# Patient Record
Sex: Female | Born: 1985 | Race: White | Hispanic: No | Marital: Single | Smoking: Current every day smoker
Health system: Southern US, Community
[De-identification: ages and names within clinical notes are randomized; demographics above are authoritative.]

## PROBLEM LIST (undated history)

## (undated) DIAGNOSIS — K8020 Calculus of gallbladder without cholecystitis without obstruction: Secondary | ICD-10-CM

## (undated) HISTORY — PX: OTHER SURGICAL HISTORY: SHX169

---
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
## 2009-09-07 ENCOUNTER — Emergency Department: Payer: Self-pay | Admitting: Emergency Medicine

## 2009-11-09 ENCOUNTER — Emergency Department: Payer: Self-pay | Admitting: Emergency Medicine

## 2011-01-04 ENCOUNTER — Emergency Department: Payer: Self-pay | Admitting: *Deleted

## 2014-03-04 ENCOUNTER — Ambulatory Visit: Payer: Self-pay | Admitting: Family Medicine

## 2014-03-04 LAB — URINALYSIS, COMPLETE

## 2014-03-04 LAB — COMPREHENSIVE METABOLIC PANEL
AST: 362 U/L — AB (ref 15–37)
Albumin: 3.9 g/dL (ref 3.4–5.0)
Alkaline Phosphatase: 100 U/L
Anion Gap: 10 (ref 7–16)
BUN: 11 mg/dL (ref 7–18)
Bilirubin,Total: 1.8 mg/dL — ABNORMAL HIGH (ref 0.2–1.0)
CO2: 26 mmol/L (ref 21–32)
CREATININE: 0.88 mg/dL (ref 0.60–1.30)
Calcium, Total: 9.2 mg/dL (ref 8.5–10.1)
Chloride: 102 mmol/L (ref 98–107)
EGFR (Non-African Amer.): >60
Glucose: 104 mg/dL — ABNORMAL HIGH (ref 65–99)
OSMOLALITY: 275 (ref 275–301)
POTASSIUM: 3.5 mmol/L (ref 3.5–5.1)
SGPT (ALT): 569 U/L — ABNORMAL HIGH
SODIUM: 138 mmol/L (ref 136–145)
Total Protein: 8 g/dL (ref 6.4–8.2)

## 2014-03-04 LAB — CBC WITH DIFFERENTIAL/PLATELET
BASOS ABS: 0.1 10*3/uL (ref 0.0–0.1)
Basophil %: 0.5 %
EOS ABS: 0.1 10*3/uL (ref 0.0–0.7)
Eosinophil %: 1.3 %
HCT: 43.5 % (ref 35.0–47.0)
HGB: 14.7 g/dL (ref 12.0–16.0)
Lymphocyte #: 1.9 10*3/uL (ref 1.0–3.6)
Lymphocyte %: 18.5 %
MCH: 31.3 pg (ref 26.0–34.0)
MCHC: 33.8 g/dL (ref 32.0–36.0)
MCV: 93 fL (ref 80–100)
Monocyte #: 0.7 x10 3/mm (ref 0.2–0.9)
Monocyte %: 7.2 %
NEUTROS ABS: 7.6 10*3/uL — AB (ref 1.4–6.5)
NEUTROS PCT: 72.5 %
Platelet: 312 10*3/uL (ref 150–440)
RBC: 4.69 10*6/uL (ref 3.80–5.20)
RDW: 12.7 % (ref 11.5–14.5)
WBC: 10.4 10*3/uL (ref 3.6–11.0)

## 2014-03-04 LAB — PREGNANCY, URINE: PREGNANCY TEST, URINE: NEGATIVE m[IU]/mL

## 2014-03-05 ENCOUNTER — Encounter: Payer: Self-pay | Admitting: *Deleted

## 2014-03-06 LAB — URINE CULTURE

## 2014-03-16 ENCOUNTER — Ambulatory Visit: Payer: Self-pay | Admitting: General Surgery

## 2014-04-01 ENCOUNTER — Encounter: Payer: Self-pay | Admitting: *Deleted

## 2016-05-03 LAB — HM PAP SMEAR: HM Pap smear: NEGATIVE

## 2016-09-07 ENCOUNTER — Emergency Department
Admission: EM | Admit: 2016-09-07 | Discharge: 2016-09-07 | Disposition: A | Discharge status: Home / Self Care | Payer: Self-pay | Attending: Emergency Medicine | Admitting: Emergency Medicine

## 2016-09-07 ENCOUNTER — Encounter: Payer: Self-pay | Admitting: Emergency Medicine

## 2016-09-07 DIAGNOSIS — Z79899 Other long term (current) drug therapy: Secondary | ICD-10-CM | POA: Insufficient documentation

## 2016-09-07 DIAGNOSIS — K2900 Acute gastritis without bleeding: Secondary | ICD-10-CM | POA: Insufficient documentation

## 2016-09-07 DIAGNOSIS — R1010 Upper abdominal pain, unspecified: Secondary | ICD-10-CM | POA: Insufficient documentation

## 2016-09-07 DIAGNOSIS — F17200 Nicotine dependence, unspecified, uncomplicated: Secondary | ICD-10-CM | POA: Insufficient documentation

## 2016-09-07 HISTORY — DX: Calculus of gallbladder without cholecystitis without obstruction: K80.20

## 2016-09-07 LAB — COMPREHENSIVE METABOLIC PANEL
ALBUMIN: 4.1 g/dL (ref 3.5–5.0)
ALT: 15 U/L (ref 14–54)
AST: 16 U/L (ref 15–41)
Alkaline Phosphatase: 49 U/L (ref 38–126)
Anion gap: 7 (ref 5–15)
BILIRUBIN TOTAL: 0.3 mg/dL (ref 0.3–1.2)
BUN: 16 mg/dL (ref 6–20)
CHLORIDE: 107 mmol/L (ref 101–111)
CO2: 26 mmol/L (ref 22–32)
CREATININE: 0.7 mg/dL (ref 0.44–1.00)
Calcium: 9.4 mg/dL (ref 8.9–10.3)
GFR calc Af Amer: >60 mL/min (ref >60)
GFR calc non Af Amer: >60 mL/min (ref >60)
GLUCOSE: 100 mg/dL — AB (ref 65–99)
POTASSIUM: 4.4 mmol/L (ref 3.5–5.1)
Sodium: 140 mmol/L (ref 135–145)
Total Protein: 7.7 g/dL (ref 6.5–8.1)

## 2016-09-07 LAB — URINALYSIS, COMPLETE (UACMP) WITH MICROSCOPIC
Bilirubin Urine: NEGATIVE
Glucose, UA: NEGATIVE mg/dL
Ketones, ur: NEGATIVE mg/dL
Leukocytes, UA: NEGATIVE
Nitrite: NEGATIVE
PH: 5 (ref 5.0–8.0)
Protein, ur: NEGATIVE mg/dL
Specific Gravity, Urine: 1.023 (ref 1.005–1.030)

## 2016-09-07 LAB — CBC
HEMATOCRIT: 43.1 % (ref 35.0–47.0)
Hemoglobin: 14.9 g/dL (ref 12.0–16.0)
MCH: 31.7 pg (ref 26.0–34.0)
MCHC: 34.4 g/dL (ref 32.0–36.0)
MCV: 92.1 fL (ref 80.0–100.0)
PLATELETS: 313 10*3/uL (ref 150–440)
RBC: 4.68 MIL/uL (ref 3.80–5.20)
RDW: 12.8 % (ref 11.5–14.5)
WBC: 8 10*3/uL (ref 3.6–11.0)

## 2016-09-07 LAB — LIPASE, BLOOD: LIPASE: 22 U/L (ref 11–51)

## 2016-09-07 LAB — POCT PREGNANCY, URINE: PREG TEST UR: NEGATIVE

## 2016-09-07 MED ORDER — ONDANSETRON 4 MG PO TBDP: 4.0000 mg | ORAL_TABLET | Freq: Three times a day (TID) | ORAL | 0 refills | Status: DC | PRN

## 2016-09-07 MED ORDER — FAMOTIDINE 20 MG PO TABS: 20.0000 mg | ORAL_TABLET | Freq: Two times a day (BID) | ORAL | 0 refills | Status: DC

## 2016-09-07 MED ORDER — FAMOTIDINE 20 MG PO TABS
40.0000 mg | ORAL_TABLET | Freq: Once | ORAL | Status: AC
  Administered 2016-09-07: 40 mg via ORAL
  Filled 2016-09-07: qty 2

## 2016-09-07 MED ORDER — GI COCKTAIL ~~LOC~~
30.0000 mL | ORAL | Status: AC
  Administered 2016-09-07: 30 mL via ORAL
  Filled 2016-09-07: qty 30

## 2016-09-07 NOTE — ED Provider Notes (Signed)
[facility] Emergency Department Provider Note  ____________________________________________  Time seen: Approximately 2:39 PM  I have reviewed the triage vital signs and the nursing notes.   HISTORY  Chief Complaint Abdominal Pain    HPI Tammy Nash is a 31 y.o. female who complains of upper abdominal pain that started yesterday. Coming in intermittent episodes, nonradiating, crampy and sharp. Associated nausea but no vomiting diarrhea or constipation. Currently feeling calm and comfortable. No fever or chills. No syncope. No dysuria frequency urgency. No aggravating or alleviating factors to the abdominal pain.     Past Medical History:  Diagnosis Date  . Cholecystolithiasis      There are no active problems to display for this patient.    History reviewed. No pertinent surgical history.   Prior to Admission medications   Medication Sig Start Date End Date Taking? Authorizing Provider  acidophilus (RISAQUAD) CAPS capsule Take 1 capsule by mouth daily.   Yes [provider]  CRANBERRY PO Take 1 tablet by mouth daily.   Yes [provider]  famotidine (PEPCID) 20 MG tablet Take 1 tablet (20 mg total) by mouth 2 (two) times daily. 09/07/16   Sharman CheekStafford, Sammy Cassar, MD  ondansetron (ZOFRAN ODT) 4 MG disintegrating tablet Take 1 tablet (4 mg total) by mouth every 8 (eight) hours as needed for nausea or vomiting. 09/07/16   Sharman CheekStafford, Samoria Fedorko, MD     Allergies Patient has no known allergies.   No family history on file.  Social History Social History  Substance Use Topics  . Smoking status: Current Every Day Smoker  . Smokeless tobacco: Never Used  . Alcohol use Yes    Review of Systems  Constitutional:   No fever or chills.  ENT:   No sore throat. No rhinorrhea. Cardiovascular:   No chest pain or syncope. Respiratory:   No dyspnea or cough. Gastrointestinal:   Positive as above for abdominal pain without vomiting and  diarrhea.  Musculoskeletal:   Negative for focal pain or swelling All other systems reviewed and are negative except as documented above in ROS and HPI.  ____________________________________________   PHYSICAL EXAM:  VITAL SIGNS: ED Triage Vitals  Enc Vitals Group     BP 09/07/16 0941 119/87     Pulse Rate 09/07/16 0941 77     Resp 09/07/16 0941 14     Temp 09/07/16 0941 98 F (36.7 C)     Temp Source 09/07/16 0941 Oral     SpO2 09/07/16 0941 99 %     Weight 09/07/16 0941 167 lb (75.8 kg)     Height 09/07/16 0941 5\' 2"  (1.575 m)     Head Circumference --      Peak Flow --      Pain Score 09/07/16 0940 2     Pain Loc --      Pain Edu? --      Excl. in GC? --     Vital signs reviewed, nursing assessments reviewed.   Constitutional:   Alert and oriented. Well appearing and in no distress. Eyes:   No scleral icterus.  EOMI. No nystagmus. No conjunctival pallor. PERRL. ENT   Head:   Normocephalic and atraumatic.   Nose:   No congestion/rhinnorhea.    Mouth/Throat:   MMM, no pharyngeal erythema. No peritonsillar mass.    Neck:   No meningismus. Full ROM Hematological/Lymphatic/Immunilogical:   No cervical lymphadenopathy. Cardiovascular:   RRR. Symmetric bilateral radial and DP pulses.  No murmurs.  Respiratory:  Normal respiratory effort without tachypnea/retractions. Breath sounds are clear and equal bilaterally. No wheezes/rales/rhonchi. Gastrointestinal:   SoftWith slight suprapubic discomfort. No right upper quadrant pain or Murphy sign. Non distended. There is no CVA tenderness.  No rebound, rigidity, or guarding. Genitourinary:   deferred Musculoskeletal:   Normal range of motion in all extremities. No joint effusions.  No lower extremity tenderness.  No edema. Neurologic:   Normal speech and language.  Motor grossly intact. No gross focal neurologic deficits are appreciated.  Skin:    Skin is warm, dry and intact. No rash noted.  No petechiae, purpura,  or bullae.  ____________________________________________    LABS (pertinent positives/negatives) (all labs ordered are listed, but only abnormal results are displayed) Labs Reviewed  COMPREHENSIVE METABOLIC PANEL - Abnormal; Notable for the following:       Result Value   Glucose, Bld 100 (*)    All other components within normal limits  URINALYSIS, COMPLETE (UACMP) WITH MICROSCOPIC - Abnormal; Notable for the following:    Color, Urine YELLOW (*)    APPearance HAZY (*)    Hgb urine dipstick MODERATE (*)    Bacteria, UA RARE (*)    Squamous Epithelial / LPF 6-30 (*)    All other components within normal limits  LIPASE, BLOOD  CBC  POC URINE PREG, ED  POCT PREGNANCY, URINE   ____________________________________________   EKG    ____________________________________________    RADIOLOGY  No results found.  ____________________________________________   PROCEDURES Procedures  ____________________________________________   INITIAL IMPRESSION / ASSESSMENT AND PLAN / ED COURSE  Pertinent labs & imaging results that were available during my care of the patient were reviewed by me and considered in my medical decision making (see chart for details).  Patient well appearing no acute distress, normal vital signs, normal labs. Benign and reassuring exam.Considering the patient's symptoms, medical history, and physical examination today, I have low suspicion for cholecystitis or biliary pathology, pancreatitis, perforation or bowel obstruction, hernia, intra-abdominal abscess, AAA or dissection, volvulus or intussusception, mesenteric ischemia, or appendicitis.  Workup negative, likely gastritis related to eating new foods at the symptom onset including crab legs. Started on Zofran and Pepcid and follow up with primary care      ____________________________________________   FINAL CLINICAL IMPRESSION(S) / ED DIAGNOSES  Final diagnoses:  Pain of upper abdomen   Acute gastritis without hemorrhage, unspecified gastritis type      New Prescriptions   FAMOTIDINE (PEPCID) 20 MG TABLET    Take 1 tablet (20 mg total) by mouth 2 (two) times daily.   ONDANSETRON (ZOFRAN ODT) 4 MG DISINTEGRATING TABLET    Take 1 tablet (4 mg total) by mouth every 8 (eight) hours as needed for nausea or vomiting.     Portions of this note were generated with dragon dictation software. Dictation errors may occur despite best attempts at proofreading.    Sharman Cheek, MD 09/07/16 [phone]

## 2016-09-07 NOTE — ED Triage Notes (Signed)
Says mid upper abd pain started yesterday. 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 Has lost wwieght and did dietary changes.

## 2016-09-07 NOTE — Discharge Instructions (Signed)
Your blood tests and urine tests today were unremarkable. Follow-up with primary care for continued monitoring of your symptoms.

## 2017-01-29 ENCOUNTER — Encounter: Payer: Self-pay | Admitting: Emergency Medicine

## 2017-01-29 ENCOUNTER — Emergency Department: Payer: Self-pay

## 2017-01-29 ENCOUNTER — Emergency Department
Admission: EM | Admit: 2017-01-29 | Discharge: 2017-01-29 | Disposition: A | Discharge status: Home / Self Care | Payer: Self-pay | Attending: Emergency Medicine | Admitting: Emergency Medicine

## 2017-01-29 DIAGNOSIS — Y999 Unspecified external cause status: Secondary | ICD-10-CM | POA: Insufficient documentation

## 2017-01-29 DIAGNOSIS — W19XXXA Unspecified fall, initial encounter: Secondary | ICD-10-CM | POA: Insufficient documentation

## 2017-01-29 DIAGNOSIS — Y939 Activity, unspecified: Secondary | ICD-10-CM | POA: Insufficient documentation

## 2017-01-29 DIAGNOSIS — Z79899 Other long term (current) drug therapy: Secondary | ICD-10-CM | POA: Insufficient documentation

## 2017-01-29 DIAGNOSIS — Y929 Unspecified place or not applicable: Secondary | ICD-10-CM | POA: Insufficient documentation

## 2017-01-29 DIAGNOSIS — M25462 Effusion, left knee: Secondary | ICD-10-CM | POA: Insufficient documentation

## 2017-01-29 DIAGNOSIS — Z87891 Personal history of nicotine dependence: Secondary | ICD-10-CM | POA: Insufficient documentation

## 2017-01-29 MED ORDER — MELOXICAM 15 MG PO TABS: 15.0000 mg | ORAL_TABLET | Freq: Every day | ORAL | 0 refills | Status: DC

## 2017-01-29 NOTE — ED Provider Notes (Signed)
[facility] Emergency Department Provider Note  ____________________________________________  Time seen: Approximately 6:14 PM  I have reviewed the triage vital signs and the nursing notes.   HISTORY  Chief Complaint Knee Pain    HPI Tammy Nash is a 31 y.o. female who presents emergency department complaining of left knee pain.  Patient reports that she has had 2 injuries to the knee, one striking her knee on a desk as well as falling on the patient reports that the knee is swollen, painful.  She has a remote history of patellar dislocation.  Patient reports at this time she did not see any deviation of the patella and did not have to reduce pain.  She was concerned that she may have done other injury including fracture from the fall.  She has been able to bear weight on the knee while wearing the brace from a previous injury.  She denies any numbness or tingling.  No other injury or complaint.  She has not been taking any medications for this complaint prior to arrival.  Past Medical History:  Diagnosis Date  . Cholecystolithiasis     There are no active problems to display for this patient.   History reviewed. No pertinent surgical history.  Prior to Admission medications   Medication Sig Start Date End Date Taking? Authorizing Provider  acidophilus (RISAQUAD) CAPS capsule Take 1 capsule by mouth daily.    [provider]  CRANBERRY PO Take 1 tablet by mouth daily.    [provider]  famotidine (PEPCID) 20 MG tablet Take 1 tablet (20 mg total) by mouth 2 (two) times daily. 09/07/16   Sharman Cheek, MD  meloxicam (MOBIC) 15 MG tablet Take 1 tablet (15 mg total) by mouth daily. 01/29/17   Cuthriell, Delorise Royals, PA-C  ondansetron (ZOFRAN ODT) 4 MG disintegrating tablet Take 1 tablet (4 mg total) by mouth every 8 (eight) hours as needed for nausea or vomiting. 09/07/16   Sharman Cheek, MD    Allergies Patient has no known  allergies.  No family history on file.  Social History Social History  Substance Use Topics  . Smoking status: Former Smoker    Types: E-cigarettes  . Smokeless tobacco: Never Used  . Alcohol use Yes     Review of Systems  Constitutional: No fever/chills Eyes: No visual changes.  Cardiovascular: no chest pain. Respiratory: no cough. No SOB. Gastrointestinal: No abdominal pain.  No nausea, no vomiting.   Musculoskeletal: Positive for left knee pain and swelling Skin: Negative for rash, abrasions, lacerations, ecchymosis. Neurological: Negative for headaches, focal weakness or numbness. 10-point ROS otherwise negative.  ____________________________________________   PHYSICAL EXAM:  VITAL SIGNS: ED Triage Vitals  Enc Vitals Group     BP 01/29/17 1730 112/80     Pulse Rate 01/29/17 1730 86     Resp 01/29/17 1730 18     Temp 01/29/17 1730 97.9 F (36.6 C)     Temp Source 01/29/17 1730 Oral     SpO2 01/29/17 1730 98 %     Weight 01/29/17 1731 165 lb (74.8 kg)     Height 01/29/17 1731 5\' 2"  (1.575 m)     Head Circumference --      Peak Flow --      Pain Score 01/29/17 1730 3     Pain Loc --      Pain Edu? --      Excl. in GC? --      Constitutional: Alert and  oriented. Well appearing and in no acute distress. Eyes: Conjunctivae are normal. PERRL. EOMI. Head: Atraumatic. Neck: No stridor.    Cardiovascular: Normal rate, regular rhythm. Normal S1 and S2.  Good peripheral circulation. Respiratory: Normal respiratory effort without tachypnea or retractions. Lungs CTAB. Good air entry to the bases with no decreased or absent breath sounds. Musculoskeletal: Full range of motion to all extremities. No gross deformities appreciated.  His left knee is edematous when compared with right.  Patient has good range of motion with coaxing.  Patient is tender palpation superiorly to patella.  No other frank tenderness to palpation.  No frank ballottement.  Patellar tendon and  ligament are appreciated with no deficits.  Varus, valgus, Lachman's, McMurray's is negative.  Dorsalis pedis pulse intact distally.  Sensation intact distally. Neurologic:  Normal speech and language. No gross focal neurologic deficits are appreciated.  Skin:  Skin is warm, dry and intact. No rash noted. Psychiatric: Mood and affect are normal. Speech and behavior are normal. Patient exhibits appropriate insight and judgement.   ____________________________________________   LABS (all labs ordered are listed, but only abnormal results are displayed)  Labs Reviewed - No data to display ____________________________________________  EKG   ____________________________________________  RADIOLOGY Festus BarrenI, Jonathan D Cuthriell, personally viewed and evaluated these images (plain radiographs) as part of my medical decision making, as well as reviewing the written report by the radiologist.  Dg Knee Complete 4 Views Left  Result Date: 01/29/2017 CLINICAL DATA:  Patient fell with left knee pain. EXAM: LEFT KNEE - COMPLETE 4+ VIEW COMPARISON:  11/09/2009 FINDINGS: New small to moderate suprapatellar joint effusion without hematocrit or fat fluid level. No joint dislocation or acute appearing fracture. IMPRESSION: Suprapatellar joint effusion without acute fracture nor dislocations. Electronically Signed   By: Tollie Ethavid  Kwon M.D.   On: 01/29/2017 18:06    ____________________________________________    PROCEDURES  Procedure(s) performed:    Procedures    Medications - No data to display   ____________________________________________   INITIAL IMPRESSION / ASSESSMENT AND PLAN / ED COURSE  Pertinent labs & imaging results that were available during my care of the patient were reviewed by me and considered in my medical decision making (see chart for details).  Review of the [place] CSRS was performed in accordance of the [facility] prior to dispensing any controlled drugs.     Patient's  diagnosis is consistent with left knee effusion.  Patient had 2 preceding injuries.  Initial differential included fracture versus ligament rupture versus strain versus dislocation.  X-ray reveals no acute osseous abnormality.  Effusion is appreciated on x-ray.  Patient has a hinged knee brace that she is wearing and is instructed to continue same.  She is given crutches to assist with ambulation.  At this time, no indication for arthrocentesis. Patient will be discharged home with prescriptions for meloxicam for symptom improvement. Patient is to follow up with the [facility] as needed or otherwise directed. Patient is given ED precautions to return to the ED for any worsening or new symptoms.     ____________________________________________  FINAL CLINICAL IMPRESSION(S) / ED DIAGNOSES  Final diagnoses:  Effusion of left knee      NEW MEDICATIONS STARTED DURING THIS VISIT:  New Prescriptions   MELOXICAM (MOBIC) 15 MG TABLET    Take 1 tablet (15 mg total) by mouth daily.        This chart was dictated using voice recognition software/Dragon. Despite best efforts to proofread, errors can occur which can change the  meaning. Any change was purely unintentional.    Racheal Patches, PA-C 01/29/17 1843    Sharman Cheek, MD 01/29/17 2342

## 2017-01-29 NOTE — ED Notes (Signed)
Pt returned from xray

## 2017-01-29 NOTE — ED Triage Notes (Signed)
Patient to ER for c/o left knee pain. States she has dislocated knee several times in distant past. States she hit knee on side of desk at work last week, then fell on knee on Saturday. Patient has been wearing knee brace that she reports gives some relief, but states she has still had swelling.

## 2017-01-29 NOTE — ED Notes (Signed)
Pt taken to xray via stretcher  

## 2018-01-14 LAB — HM HIV SCREENING LAB: HM HIV Screening: NEGATIVE

## 2018-01-14 LAB — HM HEPATITIS C SCREENING LAB: HM Hepatitis Screen: NEGATIVE

## 2018-06-14 ENCOUNTER — Emergency Department: Payer: Self-pay

## 2018-06-14 ENCOUNTER — Emergency Department
Admission: EM | Admit: 2018-06-14 | Discharge: 2018-06-14 | Disposition: A | Discharge status: Home / Self Care | Payer: Self-pay | Attending: Emergency Medicine | Admitting: Emergency Medicine

## 2018-06-14 ENCOUNTER — Other Ambulatory Visit: Payer: Self-pay

## 2018-06-14 DIAGNOSIS — J069 Acute upper respiratory infection, unspecified: Secondary | ICD-10-CM | POA: Insufficient documentation

## 2018-06-14 DIAGNOSIS — F17290 Nicotine dependence, other tobacco product, uncomplicated: Secondary | ICD-10-CM | POA: Insufficient documentation

## 2018-06-14 DIAGNOSIS — R0981 Nasal congestion: Secondary | ICD-10-CM | POA: Insufficient documentation

## 2018-06-14 DIAGNOSIS — R05 Cough: Secondary | ICD-10-CM | POA: Insufficient documentation

## 2018-06-14 LAB — URINALYSIS, COMPLETE (UACMP) WITH MICROSCOPIC
Glucose, UA: NEGATIVE mg/dL
Hgb urine dipstick: NEGATIVE
KETONES UR: NEGATIVE mg/dL
LEUKOCYTE UA: NEGATIVE
Nitrite: NEGATIVE
PH: 6 (ref 5.0–8.0)
Protein, ur: NEGATIVE mg/dL
Specific Gravity, Urine: 1.031 — ABNORMAL HIGH (ref 1.005–1.030)

## 2018-06-14 LAB — INFLUENZA PANEL BY PCR (TYPE A & B)
Influenza A By PCR: NEGATIVE
Influenza B By PCR: NEGATIVE

## 2018-06-14 LAB — POCT PREGNANCY, URINE: PREG TEST UR: NEGATIVE

## 2018-06-14 LAB — GROUP A STREP BY PCR: Group A Strep by PCR: NOT DETECTED

## 2018-06-14 NOTE — ED Provider Notes (Signed)
[facility] Emergency Department Provider Note  ____________________________________________  Time seen: Approximately 7:20 PM  I have reviewed the triage vital signs and the nursing notes.   HISTORY  Chief Complaint Cough and Nasal Congestion    HPI Tammy Nash is a 33 y.o. female who presents the emergency department complaining of 4 days of symptoms of sore throat, cough, nasal congestion, subjective fevers.  Patient reports no headache, neck pain or stiffness, chest pain, abdominal pain, nausea or vomiting, diarrhea or constipation.  Over-the-counter medications without significant relief.  No other complaints at this time.         Past Medical History:  Diagnosis Date  . Cholecystolithiasis     There are no active problems to display for this patient.   History reviewed. No pertinent surgical history.  Prior to Admission medications   Medication Sig Start Date End Date Taking? Authorizing Provider  acidophilus (RISAQUAD) CAPS capsule Take 1 capsule by mouth daily.    [provider]  CRANBERRY PO Take 1 tablet by mouth daily.    [provider]    Allergies Patient has no known allergies.  History reviewed. No pertinent family history.  Social History Social History   Tobacco Use  . Smoking status: Current Every Day Smoker    Types: E-cigarettes  . Smokeless tobacco: Never Used  Substance Use Topics  . Alcohol use: Yes  . Drug use: Not on file     Review of Systems  Constitutional: No fever/chills Eyes: No visual changes. No discharge ENT: Positive for nasal congestion, sore throat Cardiovascular: no chest pain. Respiratory: positive for cough. No SOB. Gastrointestinal: No abdominal pain.  No nausea, no vomiting.  No diarrhea.  No constipation. Musculoskeletal: Negative for musculoskeletal pain. Skin: Negative for rash, abrasions, lacerations, ecchymosis. Neurological: Negative for headaches, focal  weakness or numbness. 10-point ROS otherwise negative.  ____________________________________________   PHYSICAL EXAM:  VITAL SIGNS: ED Triage Vitals  Enc Vitals Group     BP 06/14/18 1843 106/70     Pulse Rate 06/14/18 1843 100     Resp 06/14/18 1843 18     Temp 06/14/18 1843 98.6 F (37 C)     Temp Source 06/14/18 1843 Oral     SpO2 06/14/18 1843 98 %     Weight 06/14/18 1843 164 lb 14.5 oz (74.8 kg)     Height 06/14/18 1843 5\' 2"  (1.575 m)     Head Circumference --      Peak Flow --      Pain Score 06/14/18 1845 1     Pain Loc --      Pain Edu? --      Excl. in GC? --      Constitutional: Alert and oriented. Well appearing and in no acute distress. Eyes: Conjunctivae are normal. PERRL. EOMI. Head: Atraumatic. ENT:      Ears: EACs and TMs unremarkable bilaterally.      Nose: Mild congestion/rhinnorhea.      Mouth/Throat: Mucous membranes are moist.  Oropharynx is mildly erythematous.  Tonsils are erythematous and edematous bilaterally but are equal.  No exudates.  Uvula is midline. Neck: No stridor.  Neck is supple full range of motion Hematological/Lymphatic/Immunilogical: Scattered, mobile, nontender anterior cervical lymphadenopathy. Cardiovascular: Normal rate, regular rhythm. Normal S1 and S2.  Good peripheral circulation. Respiratory: Normal respiratory effort without tachypnea or retractions. Lungs CTAB. Good air entry to the bases with no decreased or absent breath sounds. Musculoskeletal: Full range of motion  to all extremities. No gross deformities appreciated. Neurologic:  Normal speech and language. No gross focal neurologic deficits are appreciated.  Skin:  Skin is warm, dry and intact. No rash noted. Psychiatric: Mood and affect are normal. Speech and behavior are normal. Patient exhibits appropriate insight and judgement.   ____________________________________________   LABS (all labs ordered are listed, but only abnormal results are displayed)  Labs  Reviewed  URINALYSIS, COMPLETE (UACMP) WITH MICROSCOPIC - Abnormal; Notable for the following components:      Result Value   Color, Urine YELLOW (*)    APPearance HAZY (*)    Specific Gravity, Urine 1.031 (*)    Bilirubin Urine MODERATE (*)    Bacteria, UA RARE (*)    All other components within normal limits  GROUP A STREP BY PCR  INFLUENZA PANEL BY PCR (TYPE A & B)  POC URINE PREG, ED  POCT PREGNANCY, URINE   ____________________________________________  EKG   ____________________________________________  RADIOLOGY I personally viewed and evaluated these images as part of my medical decision making, as well as reviewing the written report by the radiologist.  Dg Chest 2 View  Result Date: 06/14/2018 CLINICAL DATA:  Cough and congestion EXAM: CHEST - 2 VIEW COMPARISON:  None. FINDINGS: There is no edema or consolidation. The heart size and pulmonary vascularity are normal. No adenopathy. No bone lesions. IMPRESSION: No edema or consolidation. Electronically Signed   By: Bretta Bang III M.D.   On: 06/14/2018 20:04    ____________________________________________    PROCEDURES  Procedure(s) performed:    Procedures    Medications - No data to display   ____________________________________________   INITIAL IMPRESSION / ASSESSMENT AND PLAN / ED COURSE  Pertinent labs & imaging results that were available during my care of the patient were reviewed by me and considered in my medical decision making (see chart for details).  Review of the [place] CSRS was performed in accordance of the [facility] prior to dispensing any controlled drugs.           Patient's diagnosis is consistent with viral URI.  Patient presents emergency department with 2 to 3-day history of URI symptoms.  Patient was negative for strep and influenza testing.  Chest x-ray reveals no consolidation.  No evidence of bronchitis on exam.  Patient request options that are over-the-counter only.  I have  recommended Flonase and Robitussin for symptom relief.  Tylenol Motrin for additional symptom relief over-the-counter.  Follow-up primary care as needed..  Patient is given ED precautions to return to the ED for any worsening or new symptoms.     ____________________________________________  FINAL CLINICAL IMPRESSION(S) / ED DIAGNOSES  Final diagnoses:  Viral upper respiratory tract infection      NEW MEDICATIONS STARTED DURING THIS VISIT:  ED Discharge Orders    None          This chart was dictated using voice recognition software/Dragon. Despite best efforts to proofread, errors can occur which can change the meaning. Any change was purely unintentional.    Racheal Patches, PA-C 06/14/18 2200    Arnaldo Natal, MD 06/15/18 [phone]

## 2018-06-14 NOTE — ED Notes (Signed)
See triage note  Presents with nasal congestion and cough since Monday  Unsure of fever   Afebrile on arrival

## 2018-06-14 NOTE — ED Triage Notes (Signed)
Pt states cough, sore throat and congestion. Symptoms since Monday. A&O, mask in place. Ambulatory. No distress noted.

## 2018-11-04 ENCOUNTER — Ambulatory Visit: Payer: Self-pay

## 2018-12-26 ENCOUNTER — Ambulatory Visit: Payer: Self-pay | Admitting: Physician Assistant

## 2018-12-26 ENCOUNTER — Other Ambulatory Visit: Payer: Self-pay

## 2018-12-26 ENCOUNTER — Encounter: Payer: Self-pay | Admitting: Physician Assistant

## 2018-12-26 DIAGNOSIS — Z113 Encounter for screening for infections with a predominantly sexual mode of transmission: Secondary | ICD-10-CM

## 2018-12-26 LAB — WET PREP FOR TRICH, YEAST, CLUE
Trichomonas Exam: NEGATIVE
Yeast Exam: NEGATIVE

## 2018-12-26 NOTE — Progress Notes (Signed)
Here for STD screening and c/o ingrown pubic hair.Jenetta Downer, RN

## 2018-12-26 NOTE — Progress Notes (Signed)
Wet mount reviewed, no treatment indicated at this time.Hellon Vaccarella Brewer-Jensen, RN 

## 2018-12-27 NOTE — Progress Notes (Signed)
    STI clinic/screening visit  Subjective:  Tammy Nash is a 33 y.o. female being seen today for an STI screening visit. The patient reports they do not have symptoms.  Patient has the following medical conditions:  There are no active problems to display for this patient.    Chief Complaint  Patient presents with  . SEXUALLY TRANSMITTED DISEASE    HPI  Patient reports that she is not having vaginal symptoms.  Concerned that she has "something" where a hair bump was and it has gotten bigger.  Reports that it is not painful and does not drain anything when she squeezes it.    See flowsheet for further details and programmatic requirements.    The following portions of the patient's history were reviewed and updated as appropriate: allergies, current medications, past medical history, past social history, past surgical history and problem list.  Objective:  There were no vitals filed for this visit.  Physical Exam Constitutional:      General: She is not in acute distress.    Appearance: Normal appearance.  HENT:     Head: Normocephalic and atraumatic.     Mouth/Throat:     Mouth: Mucous membranes are moist.     Pharynx: Oropharynx is clear. No oropharyngeal exudate or posterior oropharyngeal erythema.  Eyes:     Conjunctiva/sclera: Conjunctivae normal.  Neck:     Musculoskeletal: Neck supple.  Pulmonary:     Effort: Pulmonary effort is normal.  Abdominal:     Palpations: Abdomen is soft. There is no mass.     Tenderness: There is no abdominal tenderness. There is no guarding or rebound.  Genitourinary:    General: Normal vulva.     Rectum: Normal.     Comments: External genitalia/pubic area without nits, lice, edema, erythema, lesions, and inguinal adenopathy. Vagina with normal mucosa and small amount of white discharge. Cervix without visible lesions. Uterus firm, mobile, nt, no masses, no CMT, no adnexal tenderness or fullness. Lymphadenopathy:   Cervical: No cervical adenopathy.  Skin:    General: Skin is warm and dry.     Findings: No bruising, erythema, lesion or rash.  Neurological:     Mental Status: She is oriented to person, place, and time.  Psychiatric:        Mood and Affect: Mood normal.        Behavior: Behavior normal.        Thought Content: Thought content normal.        Judgment: Judgment normal.       Assessment and Plan:  Tammy Nash is a 33 y.o. female presenting to the [facility] Department for STI screening  1. Screening for STD (sexually transmitted disease) Patient is without symptoms today.   Rec condoms with all sex. Await test results.  Counseled that RN will call if needs to RTC for treatment once results are back. - WET PREP FOR [place], YEAST, CLUE - HIV [place] [facility] - Syphilis Serology, [place] [facility] - [place] [facility]     No follow-ups on file.  No future appointments.  Jerene Dilling, PA

## 2019-10-02 ENCOUNTER — Other Ambulatory Visit: Payer: Self-pay

## 2019-10-02 ENCOUNTER — Ambulatory Visit: Payer: Self-pay

## 2019-10-02 ENCOUNTER — Encounter: Payer: Self-pay | Admitting: Physician Assistant

## 2019-10-02 ENCOUNTER — Ambulatory Visit (LOCAL_COMMUNITY_HEALTH_CENTER): Payer: Self-pay | Admitting: Physician Assistant

## 2019-10-02 VITALS — BP 117/78 | Ht 62.0 in | Wt 187.0 lb

## 2019-10-02 DIAGNOSIS — A5901 Trichomonal vulvovaginitis: Secondary | ICD-10-CM

## 2019-10-02 DIAGNOSIS — Z3049 Encounter for surveillance of other contraceptives: Secondary | ICD-10-CM

## 2019-10-02 DIAGNOSIS — Z113 Encounter for screening for infections with a predominantly sexual mode of transmission: Secondary | ICD-10-CM

## 2019-10-02 DIAGNOSIS — Z01419 Encounter for gynecological examination (general) (routine) without abnormal findings: Secondary | ICD-10-CM

## 2019-10-02 DIAGNOSIS — Z3009 Encounter for other general counseling and advice on contraception: Secondary | ICD-10-CM

## 2019-10-02 LAB — WET PREP FOR TRICH, YEAST, CLUE
Trichomonas Exam: POSITIVE — AB
Yeast Exam: NEGATIVE

## 2019-10-02 MED ORDER — METRONIDAZOLE 500 MG PO TABS: 2000.0000 mg | ORAL_TABLET | Freq: Once | ORAL | 0 refills | Status: AC

## 2019-10-02 NOTE — Progress Notes (Signed)
Wet mount reviewed by provider, pt treated for Trich only per provider order. Provider orders completed.

## 2019-10-02 NOTE — Progress Notes (Signed)
Family Planning Visit- Repeat Yearly Visit  Subjective:  Tammy Nash is a 34 y.o. G0P0000  being seen today for an well woman visit and to discuss family planning options.    She is currently using condoms sometimes for pregnancy prevention. Patient reports she does not want a pregnancy in the next year. Patient  does not have a problem list on file.  Chief Complaint  Patient presents with  . Contraception    PE, condoms    Patient reports that she is doing well and is still too scared to have Implanon removal done at this time.  Reports history of depression and that she occasionally vapes.  Reports an off-white vaginal discharge with odor for 2 weeks.  CBE and pap due today.   Patient denies any concerns and changes to family and personal history except as noted above.   See flowsheet for other program required questions.   Body mass index is 34.2 kg/m. - Patient is eligible for diabetes screening based on BMI and age >62?  not applicable HA1C ordered? not applicable  Patient reports 4 of partners in last year. Desires STI screening?  Yes   Has patient been screened once for HCV in the past?  Yes  No results found for: HCVAB  Does the patient have current of drug use, have a partner with drug use, and/or has been incarcerated since last result? No  If yes-- Screen for HCV through [facility]   Does the patient meet criteria for HBV testing? No  Criteria:  -Household, sexual or needle sharing contact with HBV -History of drug use -HIV positive -Those with known Hep C   Health Maintenance Due  Topic Date Due  . COVID-19 Vaccine (1) Never done  . TETANUS/TDAP  Never done  . PAP SMEAR-Modifier  05/04/2019    Review of Systems  All other systems reviewed and are negative.   The following portions of the patient's history were reviewed and updated as appropriate: allergies, current medications, past family history, past medical history, past social history,  past surgical history and problem list. Problem list updated.  Objective:   Vitals:   10/02/19 0835  BP: 117/78  Weight: 187 lb (84.8 kg)  Height: 5\' 2"  (1.575 m)    Physical Exam Vitals and nursing note reviewed.  Constitutional:      General: She is not in acute distress.    Appearance: Normal appearance.  HENT:     Head: Normocephalic and atraumatic.  Eyes:     Conjunctiva/sclera: Conjunctivae normal.  Neck:     Thyroid: No thyroid mass, thyromegaly or thyroid tenderness.  Cardiovascular:     Rate and Rhythm: Regular rhythm.  Pulmonary:     Effort: Pulmonary effort is normal.     Breath sounds: Normal breath sounds.  Chest:     Breasts:        Right: Normal.        Left: Normal.  Abdominal:     Palpations: Abdomen is soft. There is no mass.     Tenderness: There is no abdominal tenderness. There is no guarding or rebound.  Genitourinary:    General: Normal vulva.     Rectum: Normal.     Comments: External genitalia/pubic area without nits, lice, edema, erythema, lesions and inguinal adenopathy. Vagina with normal mucosa and small amount of thin, grayish, frothy discharge. Cervix without visible lesions Uterus firm, mobile, nt, no masses, no CMT, no adnexal tenderness or fullness. Musculoskeletal:  Cervical back: Neck supple. No tenderness.  Lymphadenopathy:     Cervical: No cervical adenopathy.     Upper Body:     Right upper body: No supraclavicular, axillary or pectoral adenopathy.     Left upper body: No supraclavicular, axillary or pectoral adenopathy.  Skin:    General: Skin is warm and dry.  Neurological:     Mental Status: She is alert and oriented to person, place, and time.  Psychiatric:        Mood and Affect: Mood normal.        Behavior: Behavior normal.        Thought Content: Thought content normal.        Judgment: Judgment normal.       Assessment and Plan:  Tammy Nash is a 34 y.o. female G0P0000 presenting to the [facility] Department for an yearly well woman exam/family planning visit  Contraception counseling: Reviewed all forms of birth control options in the tiered based approach. available including abstinence; over the counter/barrier methods; hormonal contraceptive medication including pill, patch, ring, injection,contraceptive implant, ECP; hormonal and nonhormonal IUDs; permanent sterilization options including vasectomy and the various tubal sterilization modalities. Risks, benefits, and typical effectiveness rates were reviewed.  Questions were answered.  Written information was also given to the patient to review.  Patient desires to continue with condoms, this was prescribed for patient. She will follow up in  1 year and prn for surveillance.  She was told to call with any further questions, or with any concerns about this method of contraception.  Emphasized use of condoms 100% of the time for STI prevention.  Patient was not a candidate for ECP today.   1. Encounter for counseling regarding contraception Reviewed with patient procedure for Implanon removal and enc to think about it and call back for appointment when ready. Rec condoms with all sex.  2. Screening for STD (sexually transmitted disease) Await test results.  Counseled that RN will call if needs to RTC for further treatment once results are back.  - WET PREP FOR TRICH, YEAST, CLUE - Gonococcus culture - Chlamydia/Gonorrhea [place] [facility] - HIV [place] [facility] - Syphilis Serology, [facility]  3. Pap smear, as part of routine gynecological examination Await test results and counseled that she should receive a letter or call about results and follow up plan in 2-3 weeks.  - IGP, Aptima HPV  4. Surveillance for contraception barrier or spermicide Continue with condoms for STD and pregnancy protection. Counseled patient re:  OTC spermicides to use with condoms.  5. Trichomonal vaginitis Treat for Trich with Metronidazole 2 g  po with food, no EtOH for 24 hr before and until 72 hr after taking medicine. No sex for 7 days and until after partner completes treatment. RTC for re-treatment if vomits < 2 hr after taking medicine.  - metroNIDAZOLE (FLAGYL) 500 MG tablet; Take 4 tablets (2,000 mg total) by mouth once for 1 dose.  Dispense: 4 tablet; Refill: 0     Return for 1 year for physical or PRN, and 3 months for TOC.  No future appointments.  Matt Holmes, PA

## 2019-10-02 NOTE — Progress Notes (Signed)
Currently has "implanon," believes she has had it about 10 years, maybe 2011; fears getting it removed.

## 2019-10-07 LAB — GONOCOCCUS CULTURE

## 2019-10-07 LAB — IGP, APTIMA HPV
HPV Aptima: NEGATIVE
PAP Smear Comment: 0

## 2020-09-16 IMAGING — CR CHEST - 2 VIEW
1 series · 2 of 2 positions shown · non-contrast
Comparison: None.

CLINICAL DATA: Cough and congestion

EXAM:
CHEST - 2 VIEW

[Series 1: dg chest 2 view · 0.14mm/px · 2 of 2 slices shown]
[im 1/2]
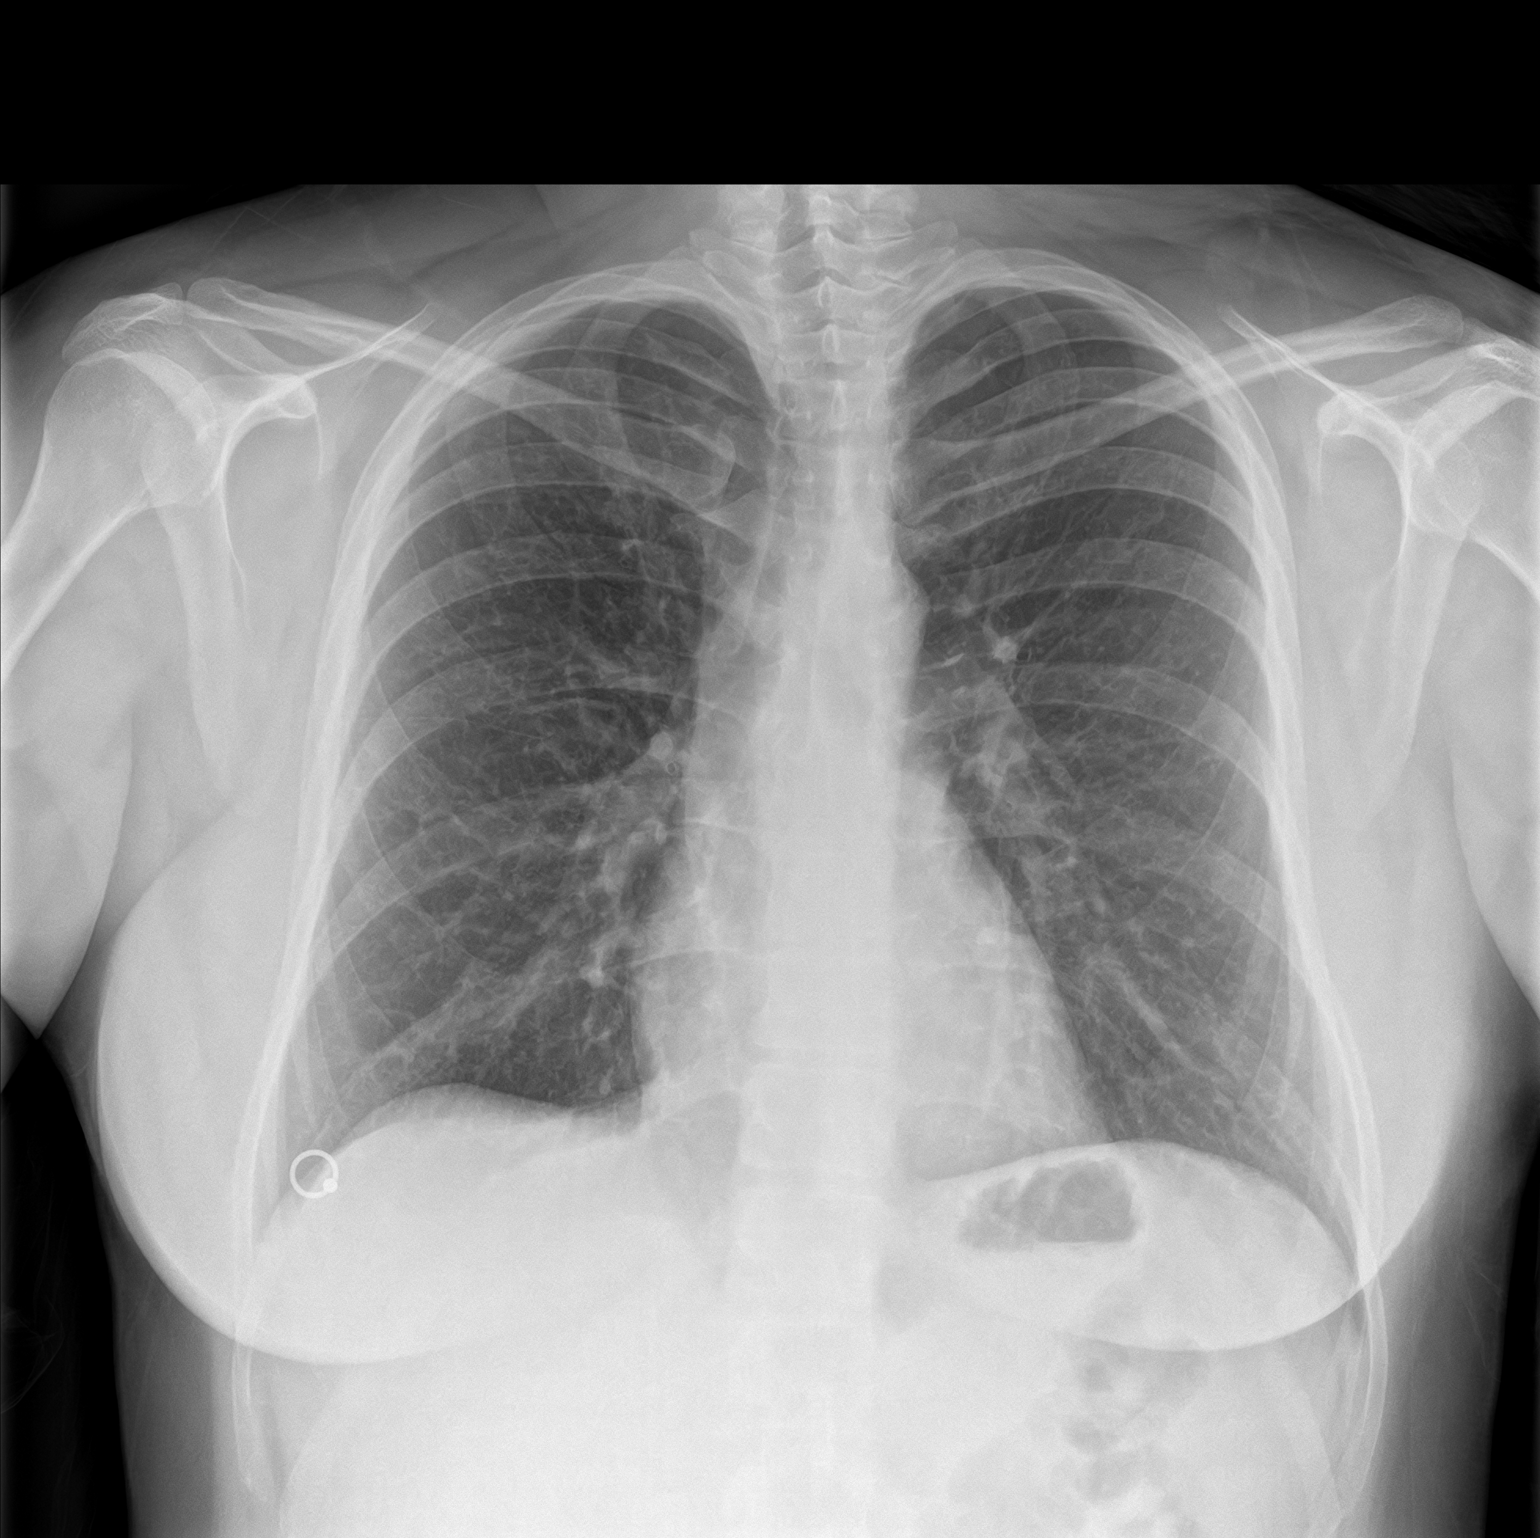
[im 2/2]
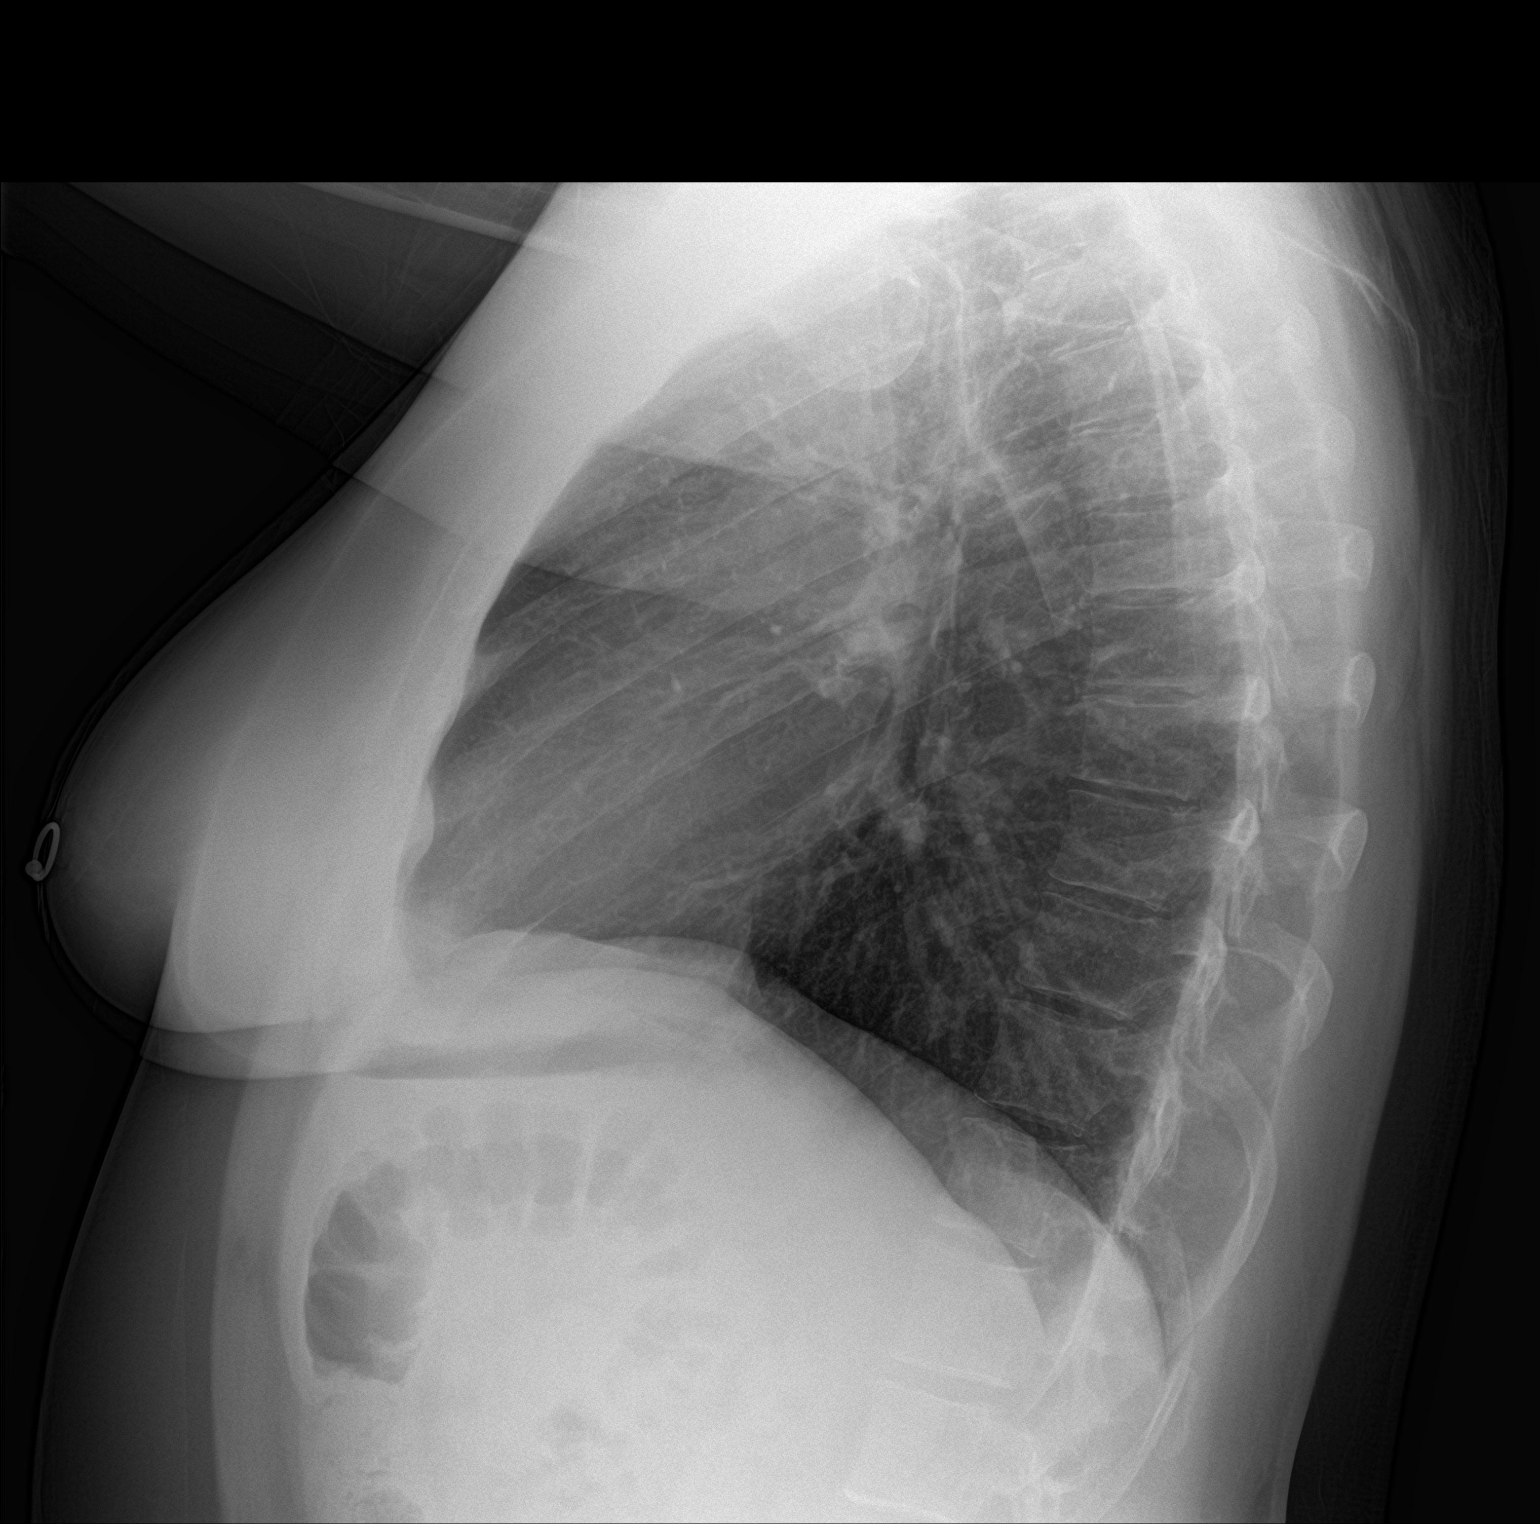

[2 of 2 positions shown; findings below may reference images not displayed]

FINDINGS: There is no edema or consolidation. The heart size and pulmonary
vascularity are normal. No adenopathy. No bone lesions.
IMPRESSION: No edema or consolidation.

## 2020-12-15 ENCOUNTER — Other Ambulatory Visit: Payer: Self-pay

## 2020-12-15 ENCOUNTER — Ambulatory Visit (LOCAL_COMMUNITY_HEALTH_CENTER): Payer: Self-pay | Admitting: Physician Assistant

## 2020-12-15 ENCOUNTER — Encounter: Payer: Self-pay | Admitting: Physician Assistant

## 2020-12-15 VITALS — BP 132/96 | Ht 62.0 in | Wt 184.8 lb

## 2020-12-15 DIAGNOSIS — Z3009 Encounter for other general counseling and advice on contraception: Secondary | ICD-10-CM

## 2020-12-15 DIAGNOSIS — Z3049 Encounter for surveillance of other contraceptives: Secondary | ICD-10-CM

## 2020-12-15 DIAGNOSIS — B9689 Other specified bacterial agents as the cause of diseases classified elsewhere: Secondary | ICD-10-CM

## 2020-12-15 DIAGNOSIS — Z113 Encounter for screening for infections with a predominantly sexual mode of transmission: Secondary | ICD-10-CM

## 2020-12-15 DIAGNOSIS — N760 Acute vaginitis: Secondary | ICD-10-CM

## 2020-12-15 DIAGNOSIS — Z0000 Encounter for general adult medical examination without abnormal findings: Secondary | ICD-10-CM

## 2020-12-15 LAB — WET PREP FOR TRICH, YEAST, CLUE
Trichomonas Exam: NEGATIVE
Yeast Exam: NEGATIVE

## 2020-12-15 MED ORDER — METRONIDAZOLE 500 MG PO TABS: 500.0000 mg | ORAL_TABLET | Freq: Two times a day (BID) | ORAL | 0 refills | Status: AC

## 2020-12-15 NOTE — Progress Notes (Signed)
Patient is here for a PE and STD check. Condoms declined. Wet mount reviewed per standing orders.

## 2020-12-16 ENCOUNTER — Encounter: Payer: Self-pay | Admitting: Physician Assistant

## 2020-12-16 NOTE — Progress Notes (Signed)
[facility] DEPARTMENT [facility] [address] Main Number: [phone]    Family Planning Visit- Initial Visit  Subjective:  Tammy Nash is a 35 y.o.  G0P0000   being seen today for an initial annual visit and to discuss contraceptive options.  The patient is currently using Condoms for pregnancy prevention. Patient reports she does not want a pregnancy in the next year.  Patient has the following medical conditions does not have a problem list on file.  Chief Complaint  Patient presents with   Contraception    Annual exam     Patient reports that she is here for her annual exam.  Reports that she is still not ready to have the Nexplanon removed.  Denies changes to her personal and family history since her last visit.  Per chart review, CBE due in 2024 and pap due 2026.  Patient denies any concerns.   Body mass index is 33.8 kg/m. - Patient is eligible for diabetes screening based on BMI and age >31?  not applicable HA1C ordered? not applicable  Patient reports 1  partner/s in last year. Desires STI screening?  Yes  Has patient been screened once for HCV in the past?  No  No results found for: HCVAB  Does the patient have current drug use (including MJ), have a partner with drug use, and/or has been incarcerated since last result? No  If yes-- Screen for HCV through [facility]   Does the patient meet criteria for HBV testing? No  Criteria:  -Household, sexual or needle sharing contact with HBV -History of drug use -HIV positive -Those with known Hep C   Health Maintenance Due  Topic Date Due   COVID-19 Vaccine (1) Never done   Pneumococcal Vaccine 59-69 Years old (1 - PCV) Never done   TETANUS/TDAP  Never done   INFLUENZA VACCINE  Never done    Review of Systems  All other systems reviewed and are negative.  The following portions of the patient's history were reviewed and updated as appropriate: allergies, current  medications, past family history, past medical history, past social history, past surgical history and problem list. Problem list updated.   See flowsheet for other program required questions.  Objective:   Vitals:   12/15/20 1445  BP: (!) 132/96  Weight: 184 lb 12.8 oz (83.8 kg)  Height: 5\' 2"  (1.575 m)    Physical Exam Vitals and nursing note reviewed.  Constitutional:      General: She is not in acute distress.    Appearance: Normal appearance.  HENT:     Head: Normocephalic and atraumatic.     Mouth/Throat:     Mouth: Mucous membranes are moist.     Pharynx: Oropharynx is clear. No oropharyngeal exudate or posterior oropharyngeal erythema.  Eyes:     Conjunctiva/sclera: Conjunctivae normal.  Neck:     Thyroid: No thyroid mass, thyromegaly or thyroid tenderness.  Cardiovascular:     Rate and Rhythm: Normal rate and regular rhythm.  Pulmonary:     Effort: Pulmonary effort is normal.     Breath sounds: Normal breath sounds.  Abdominal:     Palpations: Abdomen is soft. There is no mass.     Tenderness: There is no abdominal tenderness. There is no guarding or rebound.  Genitourinary:    General: Normal vulva.     Rectum: Normal.     Comments: External genitalia/pubic area without nits, lice, edema, erythema, lesions and inguinal  adenopathy. Vagina with normal mucosa and discharge. Cervix without visible lesions. Uterus firm, mobile, nt, no masses, no CMT, no adnexal tenderness or fullness.  Musculoskeletal:     Cervical back: Neck supple. No tenderness.  Lymphadenopathy:     Cervical: No cervical adenopathy.  Skin:    General: Skin is warm and dry.     Findings: No bruising, erythema, lesion or rash.  Neurological:     Mental Status: She is alert and oriented to person, place, and time.  Psychiatric:        Mood and Affect: Mood normal.        Behavior: Behavior normal.        Thought Content: Thought content normal.        Judgment: Judgment normal.       Assessment and Plan:  Tammy Nash is a 35 y.o. female presenting to the [facility] Department for an initial annual wellness/contraceptive visit  Contraception counseling: Reviewed all forms of birth control options in the tiered based approach. available including abstinence; over the counter/barrier methods; hormonal contraceptive medication including pill, patch, ring, injection,contraceptive implant, ECP; hormonal and nonhormonal IUDs; permanent sterilization options including vasectomy and the various tubal sterilization modalities. Risks, benefits, and typical effectiveness rates were reviewed.  Questions were answered.  Written information was also given to the patient to review.  Patient desires to continue with condoms, this was prescribed for patient. She will follow up in  1 year and prn for surveillance.  She was told to call with any further questions, or with any concerns about this method of contraception.  Emphasized use of condoms 100% of the time for STI prevention.  Patient was not a candidate for ECP today.   1. Encounter for counseling regarding contraception Reviewed with patient as above re: BCM options. Reviewed with patient that she can RTC at any time for Nexplanon removal when she is ready. Enc condoms with all sex for STD protection.   2. Screening for STD (sexually transmitted disease) Await test results.  Counseled that RN will call if needs to RTC for treatment once results are back.  - WET PREP FOR TRICH, YEAST, CLUE - Chlamydia/Gonorrhea [place] [facility] - HIV/HCV [place] [facility] - Syphilis Serology, [place] [facility]  3. Well woman exam (no gynecological exam) Reviewed with patient healthy habits to maintain general health. Enc MVI 1 po daily. Enc to follow up with PCP re: elevated BP if it is >140/90 on 3 separate occasions. (BP recheck by me 130/88). Enc to establish with/ follow up with PCP for primary care concerns, age appropriate  screenings and illness.   4. Surveillance for contraception barrier or spermicide Counseled patient that she can use OTC spermicide with condoms for added effectiveness.  5. BV (bacterial vaginosis) Treat BV with Metronidazole 500 mg #14 1 po BID for 7 days with food, no EtOH for 24 hr before and until 72 hr after completing medicine. No sex for 10 days. Enc to use OTC antifungal cream if has itching during or just after antibiotic use.  - metroNIDAZOLE (FLAGYL) 500 MG tablet; Take 1 tablet (500 mg total) by mouth 2 (two) times daily for 7 days.  Dispense: 14 tablet; Refill: 0     Return in about 1 year (around 12/15/2021) for RP and prn.  No future appointments.  Matt Holmes, PA

## 2020-12-24 NOTE — Addendum Note (Signed)
Addended by: Sadie Haber on: 12/24/2020 08:01 AM   Modules accepted: Orders

## 2021-03-28 ENCOUNTER — Other Ambulatory Visit: Payer: Self-pay

## 2021-03-28 ENCOUNTER — Emergency Department
Admission: EM | Admit: 2021-03-28 | Discharge: 2021-03-28 | Disposition: A | Discharge status: Home / Self Care | Payer: Self-pay | Attending: Emergency Medicine | Admitting: Emergency Medicine

## 2021-03-28 ENCOUNTER — Emergency Department: Payer: Self-pay

## 2021-03-28 ENCOUNTER — Encounter: Payer: Self-pay | Admitting: Emergency Medicine

## 2021-03-28 DIAGNOSIS — F17210 Nicotine dependence, cigarettes, uncomplicated: Secondary | ICD-10-CM | POA: Insufficient documentation

## 2021-03-28 DIAGNOSIS — J069 Acute upper respiratory infection, unspecified: Secondary | ICD-10-CM | POA: Insufficient documentation

## 2021-03-28 DIAGNOSIS — Z20822 Contact with and (suspected) exposure to covid-19: Secondary | ICD-10-CM | POA: Insufficient documentation

## 2021-03-28 LAB — RESP PANEL BY RT-PCR (FLU A&B, COVID) ARPGX2
Influenza A by PCR: NEGATIVE
Influenza B by PCR: NEGATIVE
SARS Coronavirus 2 by RT PCR: NEGATIVE

## 2021-03-28 MED ORDER — BENZONATATE 100 MG PO CAPS: 100.0000 mg | ORAL_CAPSULE | Freq: Three times a day (TID) | ORAL | 0 refills | Status: AC | PRN

## 2021-03-28 MED ORDER — AZITHROMYCIN 250 MG PO TABS: ORAL_TABLET | ORAL | 0 refills | Status: AC

## 2021-03-28 MED ORDER — FLUTICASONE PROPIONATE 50 MCG/ACT NA SUSP: 2.0000 | Freq: Every day | NASAL | 2 refills | Status: AC

## 2021-03-28 NOTE — ED Provider Notes (Signed)
[facility] Emergency Department Provider Note  ____________________________________________   Event Date/Time   First MD Initiated Contact with Patient 03/28/21 1431     (approximate)  I have reviewed the triage vital signs and the nursing notes.   HISTORY  Chief Complaint URI    HPI Tammy Nash is a 35 y.o. female presents to the emergency department with URI symptoms for 10 days.   Is complaining of cough, congestion, denies fever, chills, chest pain, complains of some shortness of breath, history is wheezing Denies close contact with Covid19+ patient,, patient states that now the mucus is turning green and thick.  Took 2 home COVID test which were both negative  Past Medical History:  Diagnosis Date   Cholecystolithiasis     There are no problems to display for this patient.   Past Surgical History:  Procedure Laterality Date   denies      Prior to Admission medications   Medication Sig Start Date End Date Taking? Authorizing Provider  azithromycin (ZITHROMAX Z-PAK) 250 MG tablet 2 pills today then 1 pill a day for 4 days 03/28/21  Yes Cullen Lahaie, Roselyn Bering, PA-C  benzonatate (TESSALON PERLES) 100 MG capsule Take 1 capsule (100 mg total) by mouth 3 (three) times daily as needed for cough. 03/28/21 03/28/22 Yes Gaven Eugene, Roselyn Bering, PA-C  dextromethorphan-guaiFENesin (MUCINEX DM) 30-600 MG 12hr tablet Take 1 tablet by mouth 2 (two) times daily.   Yes [provider]  fluticasone (FLONASE) 50 MCG/ACT nasal spray Place 2 sprays into both nostrils daily. 03/28/21 03/28/22 Yes Odus Clasby, Roselyn Bering, PA-C  acidophilus (RISAQUAD) CAPS capsule Take 1 capsule by mouth daily. Patient not taking: Reported on 10/02/2019    [provider]  CRANBERRY PO Take 1 tablet by mouth daily.    [provider]  etonogestrel (NEXPLANON) 68 MG IMPL implant 1 each by Subdermal route once. 11/18/15   Sciora, Austin Miles, CNM     Allergies Other  Family History  Problem Relation Age of Onset   Migraines Mother    Lung disease Mother    Arthritis Mother        "skeletal arthritis"   Alcohol abuse Father    Drug abuse Father    Migraines Sister    Diabetes Maternal Grandmother    Breast cancer Maternal Grandmother    Hypertension Maternal Grandmother    Lung disease Maternal Grandmother    Parkinson's disease Maternal Grandmother    Alzheimer's disease Maternal Grandmother    COPD Maternal Grandmother    Diabetes Maternal Grandfather    Bipolar disorder Half-Sister    Anorexia nervosa Half-Sister     Social History Social History   Tobacco Use   Smoking status: Every Day    Packs/day: 0.50    Years: 10.00    Pack years: 5.00    Types: E-cigarettes, Cigarettes   Smokeless tobacco: Never  Vaping Use   Vaping Use: Every day  Substance Use Topics   Alcohol use: Yes    Comment: occasionally   Drug use: Not Currently    Types: Marijuana    Comment: sometimess    Review of Systems  Constitutional: No fever/chills Eyes: No visual changes. ENT: Complains of sore throat. Respiratory: Complains of cough Cardiovascular: Negative chest pain Gastrointestinal: No abdominal pain Genitourinary: Negative for dysuria. Musculoskeletal: Negative for back pain. Skin: Negative for rash. Neurological: No neurological changes    ____________________________________________   PHYSICAL EXAM:  VITAL SIGNS: ED Triage Vitals  Enc Vitals  Group     BP 03/28/21 1157 (!) 127/94     Pulse Rate 03/28/21 1157 (!) 101     Resp 03/28/21 1157 16     Temp 03/28/21 1157 98.1 F (36.7 C)     Temp src --      SpO2 03/28/21 1157 97 %     Weight 03/28/21 1151 183 lb 10.3 oz (83.3 kg)     Height 03/28/21 1151 5\' 2"  (1.575 m)     Head Circumference --      Peak Flow --      Pain Score 03/28/21 1151 6     Pain Loc --      Pain Edu? --      Excl. in GC? --     Constitutional: Alert and oriented. Well  appearing and in no acute distress. Eyes: Conjunctivae are normal.  Head: Atraumatic. Nose: Active congestion/rhinnorhea. Mouth/Throat: Mucous membranes are moist.   Neck:  supple no lymphadenopathy noted Cardiovascular: Normal rate, regular rhythm. Heart sounds are normal Respiratory: Normal respiratory effort.  No retractions, lungs CTA GU: deferred Musculoskeletal: FROM all extremities, warm and well perfused Neurologic:  Normal speech and language.  Skin:  Skin is warm, dry and intact. No rash noted. Psychiatric: Mood and affect are normal. Speech and behavior are normal.  ____________________________________________   LABS (all labs ordered are listed, but only abnormal results are displayed)  Labs Reviewed  RESP PANEL BY RT-PCR (FLU A&B, COVID) [id number]   ____________________________________________   ____________________________________________  RADIOLOGY  Chest x-ray  ____________________________________________   PROCEDURES  Procedure(s) performed: No  Procedures    ____________________________________________   INITIAL IMPRESSION / ASSESSMENT AND PLAN / ED COURSE  Pertinent labs & imaging results that were available during my care of the patient were reviewed by me and considered in my medical decision making (see chart for details).   Patient is a 35 year old female who complains of URI symptoms.  Exam is consistent with upper respiratory infection  Pending test for covid\flu Chest x-ray  Chest x-ray is reviewed by me confirmed by radiology to be negative  I did explain the findings to the patient.  We will start her on a Z-Pak, Flonase, Tessalon Perles.  She is to view her respiratory panel results via [place] MyChart.  If she is positive for COVID she will need to quarantine for appropriate amount of days.  For influenza she should remain home until she has been fever free for 24 hours.  Patient states she understands.  She was discharged stable  condition.  The patient was instructed to quarantine themselves at home.  Follow-up with your regular doctor if any concerns.  Return emergency department for worsening. OTC measures discussed     [name] was evaluated in Emergency Department on 03/28/2021 for the symptoms described in the history of present illness. She was evaluated in the context of the global COVID-19 pandemic, which necessitated consideration that the patient might be at risk for infection with the SARS-CoV-2 virus that causes COVID-19. Institutional protocols and algorithms that pertain to the evaluation of patients at risk for COVID-19 are in a state of rapid change based on information released by regulatory bodies including the CDC and federal and state organizations. These policies and algorithms were followed during the patient's care in the ED.   As part of my medical decision making, I reviewed the following data within the electronic MEDICAL RECORD NUMBER Nursing notes reviewed and incorporated, Labs reviewed , Old chart  reviewed, Radiograph reviewed , Notes from prior ED visits, and [place] Controlled Substance Database  ____________________________________________   FINAL CLINICAL IMPRESSION(S) / ED DIAGNOSES  Final diagnoses:  Acute URI      NEW MEDICATIONS STARTED DURING THIS VISIT:  New Prescriptions   AZITHROMYCIN (ZITHROMAX Z-PAK) 250 MG TABLET    2 pills today then 1 pill a day for 4 days   BENZONATATE (TESSALON PERLES) 100 MG CAPSULE    Take 1 capsule (100 mg total) by mouth 3 (three) times daily as needed for cough.   FLUTICASONE (FLONASE) 50 MCG/ACT NASAL SPRAY    Place 2 sprays into both nostrils daily.     Note:  This document was prepared using Dragon voice recognition software and may include unintentional dictation errors.    Faythe Ghee, PA-C 03/28/21 1514    Arnaldo Natal, MD 03/28/21 [id number]

## 2021-03-28 NOTE — ED Triage Notes (Signed)
Presents with congestion,cough and some body aches  sxs' started about 2 weeks ago  but became worse over the past couple of days  states cough is productive

## 2021-03-28 NOTE — ED Notes (Signed)
Says has had cold sx for 2 weeks.  Highest temp was 99.9.  very nasally congested and says now her nasal drainage is green.  Today her throat started hurting as well.  No distress.

## 2021-03-28 NOTE — Discharge Instructions (Signed)
Follow-up with your regular doctor if not improving to 3 days.  Return emergency department worsening Take your medication as prescribed. Drink plenty of fluids.  Tylenol or ibuprofen for pain/fever as needed.

## 2022-09-11 ENCOUNTER — Ambulatory Visit: Payer: Self-pay | Admitting: Family Medicine

## 2022-09-11 ENCOUNTER — Encounter: Payer: Self-pay | Admitting: Family Medicine

## 2022-09-11 DIAGNOSIS — B081 Molluscum contagiosum: Secondary | ICD-10-CM

## 2022-09-11 DIAGNOSIS — Z113 Encounter for screening for infections with a predominantly sexual mode of transmission: Secondary | ICD-10-CM

## 2022-09-11 LAB — WET PREP FOR TRICH, YEAST, CLUE: Trichomonas Exam: NEGATIVE

## 2022-09-11 LAB — HM HIV SCREENING LAB: HM HIV Screening: NEGATIVE

## 2022-09-11 MED ORDER — IMIQUIMOD 5 % EX CREA
TOPICAL_CREAM | CUTANEOUS | 0 refills | Status: AC
Start: 2022-09-11 — End: ?

## 2022-09-11 NOTE — Progress Notes (Signed)
[facility] Department  STI clinic/screening visit [address] [place] [address] [phone]  Subjective:  Tammy Nash is a 37 y.o. female being seen today for an STI screening visit. The patient reports they do have symptoms.  Patient reports that they do not desire a pregnancy in the next year.   They reported they are not interested in discussing contraception today.    Patient's last menstrual period was 08/07/2022 (approximate).  Patient has the following medical conditions:  There are no problems to display for this patient.   Chief Complaint  Patient presents with   SEXUALLY TRANSMITTED DISEASE    STI screening-    HPI  Patient reports to clinic for STI testing. States she has "bumps" on her labia that have been there for months and months. States they are black- and she thought it was a blackhead maybe- She thinks it has gotten bigger. Denies itching or pain.   Does the patient using douching products? No  Last HIV test per patient/review of record was  Lab Results  Component Value Date   HMHIVSCREEN Negative - Validated 01/14/2018   No results found for: "HIV" Patient reports last pap was No results found for: "DIAGPAP"  Lab Results  Component Value Date   SPECADGYN Comment 10/02/2019    Screening for MPX risk: Does the patient have an unexplained rash? No Is the patient MSM? No Does the patient endorse multiple sex partners or anonymous sex partners? No Did the patient have close or sexual contact with a person diagnosed with MPX? No Has the patient traveled outside the [place] where MPX is endemic? No Is there a high clinical suspicion for MPX-- evidenced by one of the following No  -Unlikely to be chickenpox  -Lymphadenopathy  -Rash that present in same phase of evolution on any given body part See flowsheet for further details and programmatic requirements.   Immunization history:   There is no immunization history on  file for this patient.   The following portions of the patient's history were reviewed and updated as appropriate: allergies, current medications, past medical history, past social history, past surgical history and problem list.  Objective:  There were no vitals filed for this visit.  Physical Exam Vitals and nursing note reviewed.  Constitutional:      Appearance: Normal appearance.  HENT:     Head: Normocephalic and atraumatic.     Mouth/Throat:     Mouth: Mucous membranes are moist.     Pharynx: Oropharynx is clear. No oropharyngeal exudate or posterior oropharyngeal erythema.  Pulmonary:     Effort: Pulmonary effort is normal.  Abdominal:     General: Abdomen is flat.     Palpations: There is no mass.     Tenderness: There is no abdominal tenderness. There is no rebound.  Genitourinary:    General: Normal vulva.     Exam position: Lithotomy position.     Pubic Area: No rash or pubic lice.      Tanner stage (genital): 5.     Labia:        Right: Lesion present. No rash.        Left: No rash or lesion.      Vagina: Normal. No vaginal discharge, erythema, bleeding or lesions.     Cervix: No cervical motion tenderness, discharge, friability, lesion or erythema.     Uterus: Normal.      Adnexa: Right adnexa normal and left adnexa normal.  Rectum: Normal.       Comments: pH = 4  Blue x's- black nodules Red x's- white nodules Lymphadenopathy:     Head:     Right side of head: No preauricular or posterior auricular adenopathy.     Left side of head: No preauricular or posterior auricular adenopathy.     Cervical: No cervical adenopathy.     Upper Body:     Right upper body: No supraclavicular, axillary or epitrochlear adenopathy.     Left upper body: No supraclavicular, axillary or epitrochlear adenopathy.     Lower Body: No right inguinal adenopathy. No left inguinal adenopathy.  Skin:    General: Skin is warm and dry.     Findings: No rash.  Neurological:      Mental Status: She is alert and oriented to person, place, and time.      Assessment and Plan:  Tammy Nash is a 37 y.o. female presenting to the [facility] Department for STI screening  1. Screening for venereal disease  - WET PREP FOR TRICH, YEAST, CLUE - Chlamydia/Gonorrhea [facility] [place] [facility] - [facility] - Syphilis Serology, [facility]  2. Mollusca contagiosa -pt has 2 small white nodules on her right inferior labia  -pt has had this for months- 2 additional "bumps" are noted- and appear to be black heads -discussed that white nodules are possibly mollusca and we can tx for this -encouraged warm compress as well  - imiquimod (ALDARA) 5 % cream; Apply topically 3 (three) times a week.  Dispense: 12 each; Refill: 0   Patient accepted all screenings including vaginal CT/GC and bloodwork for HIV/RPR, and wet prep. Patient meets criteria for HepB screening? No. Ordered? not applicable Patient meets criteria for HepC screening? No. Ordered? not applicable  Treat wet prep per standing order Discussed time line for [facility] results and that patient will be called with positive results and encouraged patient to call if she had not heard in 2 weeks.  Counseled to return or seek care for continued or worsening symptoms Recommended repeat testing in 3 months with positive results. Recommended condom use with all sex  Patient is currently using  unknown  to prevent pregnancy.    Return if symptoms worsen or fail to improve, for STI screening.  No future appointments. Total time spent 20 minutes  Lenice Llamas, [place]

## 2022-09-11 NOTE — Progress Notes (Signed)
Pt here for STI screening.  Wet mount results reviewed with patient.  No treatment needed at this time as per standing orders.  Rx for Aldera sent to pt's pharmacy per Aliene Altes, FNP.  Condoms Kenna Gilbert, RN

## 2023-07-01 IMAGING — CR DG CHEST 2V
2 series · 2 of 2 positions shown · non-contrast
Comparison: 06/14/2018

CLINICAL DATA: Persistent cough, congestion

EXAM:
CHEST - 2 VIEW

[chest pa]
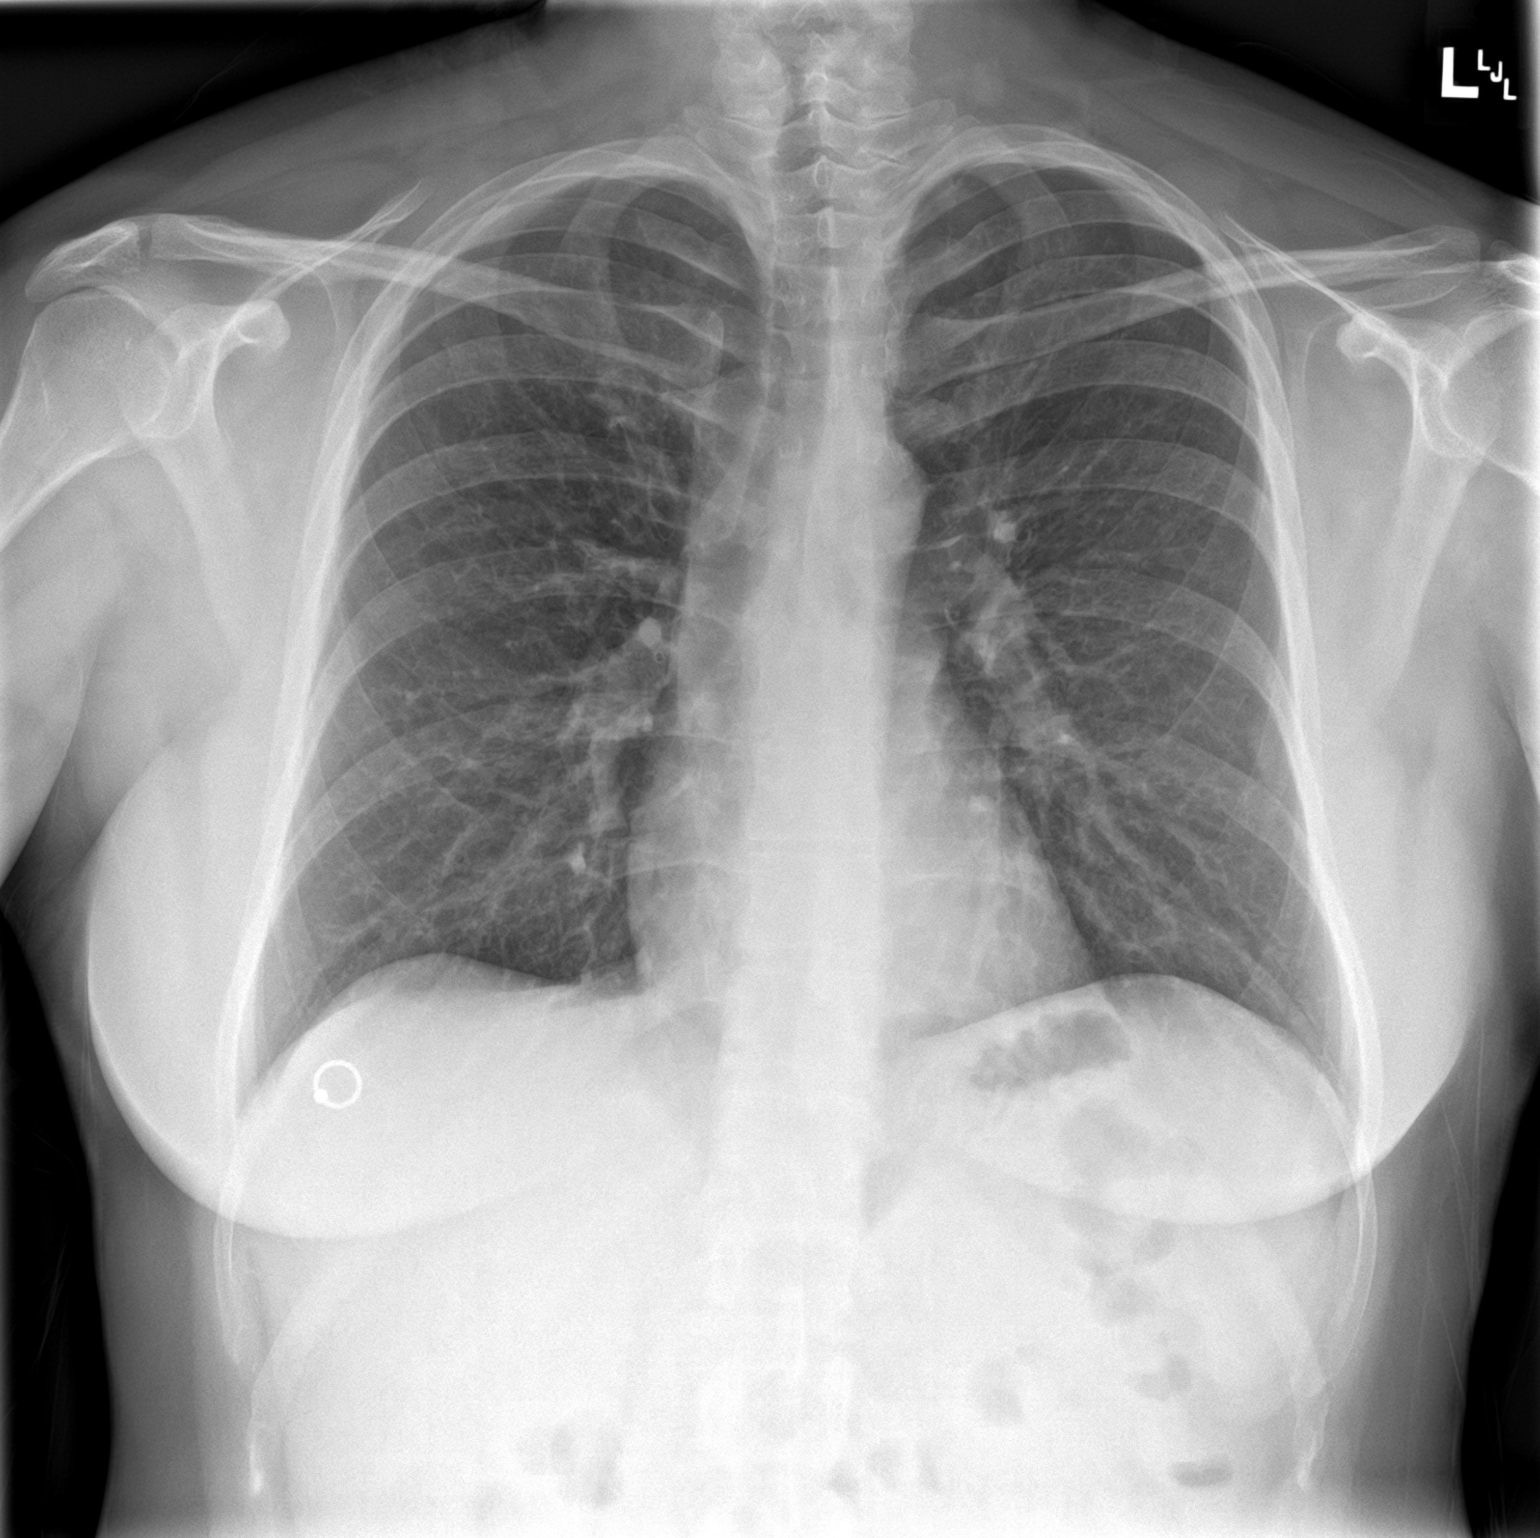

[chest lat]
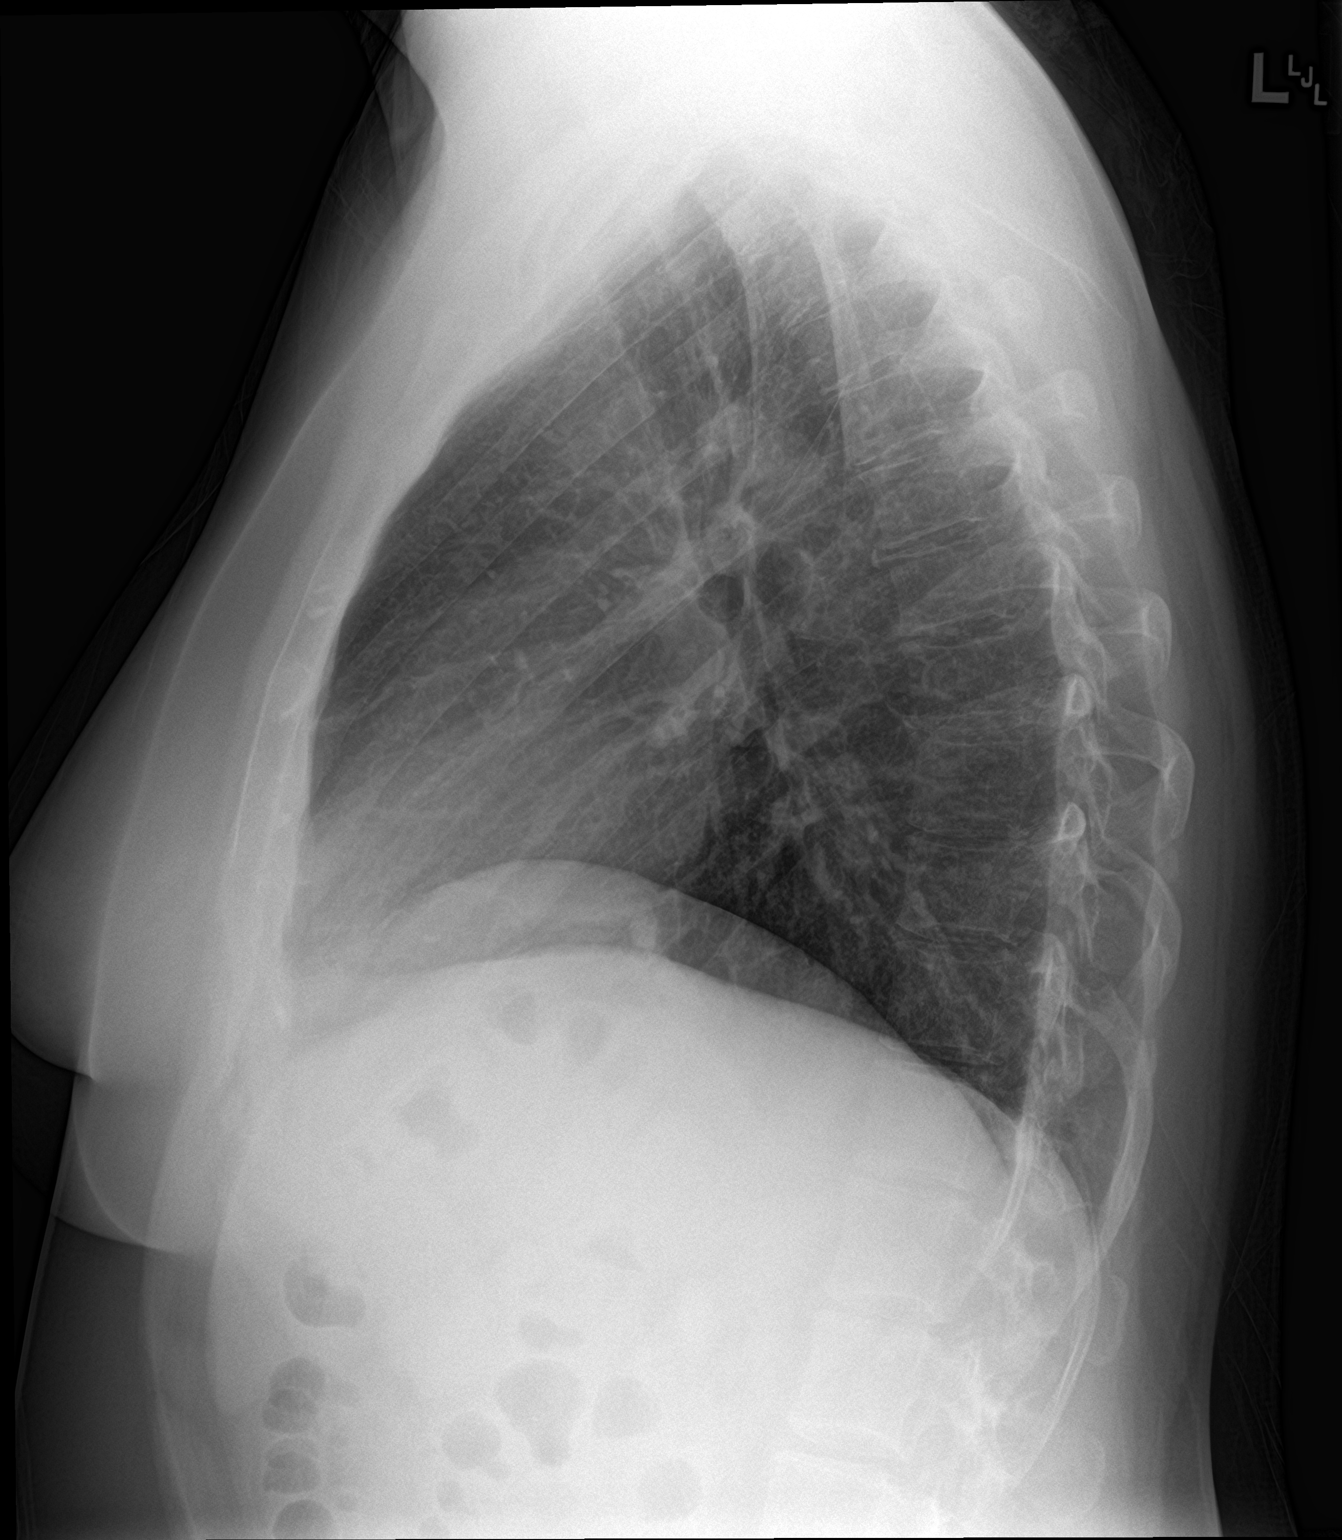

[2 of 2 positions shown; findings below may reference images not displayed]

FINDINGS: The heart size and mediastinal contours are within normal limits.
Both lungs are clear. The visualized skeletal structures are
unremarkable.
IMPRESSION: No active cardiopulmonary disease.
